# Patient Record
Sex: Male | Born: 1950 | Race: White | Hispanic: No | Marital: Single | Smoking: Current every day smoker
Health system: Southern US, Community
[De-identification: ages and names within clinical notes are randomized; demographics above are authoritative.]

## PROBLEM LIST (undated history)

## (undated) DIAGNOSIS — B009 Herpesviral infection, unspecified: Secondary | ICD-10-CM

## (undated) DIAGNOSIS — R011 Cardiac murmur, unspecified: Secondary | ICD-10-CM

## (undated) HISTORY — DX: Cardiac murmur, unspecified: R01.1

## (undated) HISTORY — DX: Herpesviral infection, unspecified: B00.9

---

## 2004-02-29 ENCOUNTER — Ambulatory Visit: Payer: Self-pay | Admitting: Family Medicine

## 2004-03-21 ENCOUNTER — Ambulatory Visit: Payer: Self-pay | Admitting: Family Medicine

## 2004-05-24 ENCOUNTER — Ambulatory Visit: Payer: Self-pay | Admitting: Family Medicine

## 2004-11-23 ENCOUNTER — Ambulatory Visit: Payer: Self-pay | Admitting: Family Medicine

## 2005-03-30 ENCOUNTER — Ambulatory Visit: Payer: Self-pay | Admitting: Family Medicine

## 2005-11-14 ENCOUNTER — Ambulatory Visit: Payer: Self-pay | Admitting: Family Medicine

## 2006-02-05 ENCOUNTER — Ambulatory Visit: Payer: Self-pay | Admitting: Family Medicine

## 2007-02-18 ENCOUNTER — Ambulatory Visit: Payer: Self-pay | Admitting: Family Medicine

## 2007-12-10 ENCOUNTER — Ambulatory Visit: Payer: Self-pay | Admitting: Family Medicine

## 2007-12-10 DIAGNOSIS — A6000 Herpesviral infection of urogenital system, unspecified: Secondary | ICD-10-CM | POA: Insufficient documentation

## 2007-12-12 ENCOUNTER — Telehealth: Payer: Self-pay

## 2008-02-24 ENCOUNTER — Ambulatory Visit: Payer: Self-pay | Admitting: Family Medicine

## 2008-02-24 DIAGNOSIS — B354 Tinea corporis: Secondary | ICD-10-CM | POA: Insufficient documentation

## 2008-04-15 ENCOUNTER — Telehealth: Payer: Self-pay | Admitting: Family Medicine

## 2008-09-07 ENCOUNTER — Ambulatory Visit: Payer: Self-pay | Admitting: Family Medicine

## 2008-09-07 DIAGNOSIS — L84 Corns and callosities: Secondary | ICD-10-CM | POA: Insufficient documentation

## 2008-09-14 ENCOUNTER — Ambulatory Visit: Payer: Self-pay | Admitting: Family Medicine

## 2008-09-14 LAB — CONVERTED CEMR LAB
Blood in Urine, dipstick: NEGATIVE
Nitrite: NEGATIVE
Protein, U semiquant: NEGATIVE
Urobilinogen, UA: 0.2
WBC Urine, dipstick: NEGATIVE

## 2008-09-21 ENCOUNTER — Ambulatory Visit: Payer: Self-pay | Admitting: Family Medicine

## 2008-09-21 DIAGNOSIS — M479 Spondylosis, unspecified: Secondary | ICD-10-CM | POA: Insufficient documentation

## 2008-09-21 LAB — CONVERTED CEMR LAB
Alkaline Phosphatase: 40 units/L (ref 39–117)
Basophils Absolute: 0 10*3/uL (ref 0.0–0.1)
Bilirubin, Direct: 0.1 mg/dL (ref 0.0–0.3)
Calcium: 9 mg/dL (ref 8.4–10.5)
GFR calc non Af Amer: 91.58 mL/min (ref >60)
Glucose, Bld: 88 mg/dL (ref 70–99)
HDL: 65.3 mg/dL (ref >39.00)
Hemoglobin: 13.9 g/dL (ref 13.0–17.0)
LDL Cholesterol: 97 mg/dL (ref 0–99)
Lymphocytes Relative: 24.8 % (ref 12.0–46.0)
Monocytes Relative: 8.8 % (ref 3.0–12.0)
Neutro Abs: 2.5 10*3/uL (ref 1.4–7.7)
RBC: 3.94 M/uL — ABNORMAL LOW (ref 4.22–5.81)
RDW: 11.5 % (ref 11.5–14.6)
Sodium: 141 meq/L (ref 135–145)
Total CHOL/HDL Ratio: 3
Total Protein: 6.7 g/dL (ref 6.0–8.3)
VLDL: 13.2 mg/dL (ref 0.0–40.0)
WBC: 5.7 10*3/uL (ref 4.5–10.5)

## 2008-09-30 ENCOUNTER — Ambulatory Visit: Payer: Self-pay | Admitting: Family Medicine

## 2008-09-30 LAB — CONVERTED CEMR LAB
OCCULT 1: NEGATIVE
OCCULT 2: NEGATIVE

## 2008-10-13 ENCOUNTER — Encounter: Payer: Self-pay | Admitting: Family Medicine

## 2008-11-22 ENCOUNTER — Encounter: Payer: Self-pay | Admitting: Family Medicine

## 2009-01-25 ENCOUNTER — Ambulatory Visit: Payer: Self-pay | Admitting: Family Medicine

## 2010-03-07 ENCOUNTER — Ambulatory Visit: Payer: Self-pay | Admitting: Family Medicine

## 2010-05-04 NOTE — Assessment & Plan Note (Signed)
Summary: FUP ON FUNGAL ISSUES/FLU SHOT/CJR   Vital Signs:  Patient profile:   60 year old male Height:      69.5 inches Weight:      162 pounds BMI:     23.67 O2 Sat:      95 % on Room air Temp:     98.4 degrees F oral Pulse rate:   76 / minute Pulse rhythm:   irregular BP sitting:   120 / 80  (left arm) Cuff size:   regular  Vitals Entered By: Romualdo Bolk, CMA (AAMA) (March 07, 2010 12:27 PM)  O2 Flow:  Room air CC: Follow-up visit on fungal infection   History of Present Illness: This 60 year old white single male is in for refill of his medications. He relates that he has had a recent flareup of herpes genitalis and would like to refill his Valtrex assailant for preventive measures. Also has developed a fungal infection in the inguinal region and around the scrotal Otherwise his arthritis is under good control and if it reoccurs slightly he uses ibuprofen Discussed taking aspirin 81 mg q.d. Proventil of CVA and MI Patient relates he continues to have problem with calluses on his feet but he is under the care of Dr. Lestine Box  Preventive Screening-Counseling & Management  Alcohol-Tobacco     Smoking Status: current  Current Medications (verified): 1)  Lotrisone 1-0.05 % Crea (Clotrimazole-Betamethasone) .... Apply Two Times A Day For 10 Days, Repeat Later As Needed 2)  Valtrex 500 Mg Tabs (Valacyclovir Hcl) .Marland Kitchen.. 1 Three Times A Day  For I Month Then 1 Per Day, Hold Until Pt Requests  Allergies (verified): 1)  ! Jonne Ply  Past History:  Past Surgical History: Last updated: 12/09/2006 Denies surgical history  Risk Factors: Smoking Status: current (03/07/2010)  Past Medical History: Heart Murmur herpes simplex2  Review of Systems      See HPI  The patient denies anorexia, fever, weight loss, weight gain, vision loss, decreased hearing, hoarseness, chest pain, syncope, dyspnea on exertion, peripheral edema, prolonged cough, headaches, hemoptysis, abdominal  pain, melena, hematochezia, severe indigestion/heartburn, hematuria, incontinence, genital sores, muscle weakness, suspicious skin lesions, transient blindness, difficulty walking, depression, unusual weight change, abnormal bleeding, enlarged lymph nodes, angioedema, breast masses, and testicular masses.    Physical Exam  General:  Well-developed,well-nourished,in no acute distress; alert,appropriate and cooperative throughout examination Lungs:  Normal respiratory effort, chest expands symmetrically. Lungs are clear to auscultation, no crackles or wheezes. 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  Complete Medication List: 1)  Lotrisone 1-0.05 % Crea (Clotrimazole-betamethasone) .... Apply two times a day for 10 days, repeat later as needed 2)  Valtrex 500 Mg Tabs (Valacyclovir hcl) .Marland Kitchen.. 1 qd 3)  Aspir-low 81 Mg Tbec (Aspirin) .Marland Kitchen.. 1 qd  Other Orders: Admin 1st Vaccine (98119) Flu Vaccine 62yrs + (14782)  Patient Instructions: 1)  he did have a recurrence of your possible infection and herpes I have refilled her medications for treatment 2)  Recommend starting aspirin 81 mg q.d. Prescriptions: LOTRISONE 1-0.05 % CREA (CLOTRIMAZOLE-BETAMETHASONE) apply two times a day for 10 days, repeat later  as needed  #45 gms x 11   Entered and Authorized by:   Judithann Sheen MD   Signed by:   Judithann Sheen MD on 03/07/2010   Method used:   Electronically to        [id number] (retail)       [address]       [place], [place]  [address]       Ph: [phone]       Fax:  [phone]   RxID:   [id number] VALTREX 500 MG TABS (VALACYCLOVIR HCL) 1 qd  #90 x 3   Entered and Authorized by:   Judithann Sheen MD   Signed by:   Judithann Sheen MD on 03/07/2010   Method used:   Electronically to        [facility] (retail)       [address]       [place], [place]  [address]       Ph: [phone]       Fax: [phone]   RxID:   [id number]    Orders Added: 1)  Admin 1st Vaccine [90471] 2)  Flu Vaccine 62yrs + [88416] 3)  Est. Patient Level III [60630]   Flu Vaccine Consent Questions     Do you have a history of severe allergic reactions to this vaccine? no    Any prior history of allergic reactions to egg and/or gelatin? no    Do you have a sensitivity to the preservative Thimersol? no    Do you have a past history of Guillan-Barre Syndrome? no    Do you currently have an acute febrile illness? no    Have you ever had a severe reaction to latex? no    Vaccine information given and explained to patient? yes    Are you currently pregnant? no    Lot Number:[id number]   Exp Date:09/30/2010   Site Given  Left Deltoid IM.lbflu Romualdo Bolk, CMA (AAMA)  March 07, 2010 12:29 PM

## 2010-07-25 ENCOUNTER — Other Ambulatory Visit (INDEPENDENT_AMBULATORY_CARE_PROVIDER_SITE_OTHER): Payer: BC Managed Care – PPO | Admitting: Family Medicine

## 2010-07-25 DIAGNOSIS — Z0000 Encounter for general adult medical examination without abnormal findings: Secondary | ICD-10-CM

## 2010-07-25 LAB — CBC WITH DIFFERENTIAL/PLATELET
Eosinophils Relative: 19 % — ABNORMAL HIGH (ref 0.0–5.0)
HCT: 40.5 % (ref 39.0–52.0)
Hemoglobin: 14 g/dL (ref 13.0–17.0)
Lymphs Abs: 1.4 10*3/uL (ref 0.7–4.0)
Monocytes Relative: 7.3 % (ref 3.0–12.0)
Neutro Abs: 3.7 10*3/uL (ref 1.4–7.7)
RBC: 3.97 Mil/uL — ABNORMAL LOW (ref 4.22–5.81)
WBC: 6.9 10*3/uL (ref 4.5–10.5)

## 2010-07-25 LAB — HEPATIC FUNCTION PANEL
ALT: 15 U/L (ref 0–53)
AST: 17 U/L (ref 0–37)
Albumin: 4.1 g/dL (ref 3.5–5.2)

## 2010-07-25 LAB — POCT URINALYSIS DIPSTICK
Glucose, UA: NEGATIVE
Ketones, UA: NEGATIVE
Leukocytes, UA: NEGATIVE
Spec Grav, UA: 1.02
Urobilinogen, UA: 0.2

## 2010-07-25 LAB — BASIC METABOLIC PANEL
GFR: 117.74 mL/min (ref >60.00)
Glucose, Bld: 92 mg/dL (ref 70–99)
Potassium: 4.2 mEq/L (ref 3.5–5.1)
Sodium: 139 mEq/L (ref 135–145)

## 2010-07-25 LAB — LIPID PANEL
Cholesterol: 178 mg/dL (ref 0–200)
LDL Cholesterol: 81 mg/dL (ref 0–99)

## 2010-07-25 LAB — TSH: TSH: 1.74 u[IU]/mL (ref 0.35–5.50)

## 2010-07-25 LAB — PSA: PSA: 1.97 ng/mL (ref 0.10–4.00)

## 2010-08-01 ENCOUNTER — Encounter: Payer: Self-pay | Admitting: Family Medicine

## 2010-08-01 ENCOUNTER — Ambulatory Visit (INDEPENDENT_AMBULATORY_CARE_PROVIDER_SITE_OTHER): Payer: BC Managed Care – PPO | Admitting: Family Medicine

## 2010-08-01 VITALS — BP 124/70 | HR 77 | Temp 98.0°F | Ht 69.5 in | Wt 157.0 lb

## 2010-08-01 DIAGNOSIS — Z136 Encounter for screening for cardiovascular disorders: Secondary | ICD-10-CM

## 2010-08-01 DIAGNOSIS — Z8619 Personal history of other infectious and parasitic diseases: Secondary | ICD-10-CM

## 2010-08-01 DIAGNOSIS — Z0000 Encounter for general adult medical examination without abnormal findings: Secondary | ICD-10-CM

## 2010-08-14 ENCOUNTER — Encounter: Payer: Self-pay | Admitting: Family Medicine

## 2010-08-14 NOTE — Progress Notes (Signed)
  Subjective:    Patient ID: Howard Duncan, male    DOB: 09/21/1950, 60 y.o.   MRN: [id number] 60 year old white male employee of  [employer] is in for complete physical examination and relates that he has no complaint he does have past history of calluses which are under control at this time he relates he is doing fine laboratory studies were good also we'll maintenance therapy for herpes simplex 2 HPI    Review of Systems  Constitutional: Negative.   HENT: Negative.   Eyes: Negative.   Respiratory: Negative.   Cardiovascular: Negative.   Gastrointestinal: Negative.   Genitourinary: Negative.        History herpes simplex 2  Musculoskeletal: Negative.   Skin: Negative.   Neurological: Negative.   Hematological: Negative.   Psychiatric/Behavioral: Negative.        Objective:   Physical Exam patient is well-developed well-nourished pleasant male in no distress HEENT negative carotids normal thyroid nonpalpable Lungs clear to palpation percussion  Auscultation,No rales no dullness no wheezing Heart no evidence of cardiomegaly heart sounds without murmur peripheral pulsations are good bilaterally Abdomen liver spleen kidney nonpalpable no masses aorta percusses normal inguinal regionsgions normal Genitalia negative Prostate completely normal no masses no tenderness Extremities negative except for evidence of previous calluses on the bottom of the feet no evidence of infection Neurological examination negative deep tendon reflexes 2+ bilaterally Skin negative       Assessment & Plan:  Physical examination and laboratory studies are all well within normal limits EKG normal Refill medication of Lotrisoneand Valtrex

## 2010-08-14 NOTE — Patient Instructions (Signed)
The physical examination and laboratory studies are allithin normal limits Electrocardiogram normal Refill Lotrisone if you have recurrence of the rash Continue herpes simplex 2 preventative therapy of Valtrex

## 2010-08-22 ENCOUNTER — Other Ambulatory Visit (INDEPENDENT_AMBULATORY_CARE_PROVIDER_SITE_OTHER): Payer: BC Managed Care – PPO | Admitting: Family Medicine

## 2010-08-22 DIAGNOSIS — K921 Melena: Secondary | ICD-10-CM

## 2010-08-22 DIAGNOSIS — IMO0001 Reserved for inherently not codable concepts without codable children: Secondary | ICD-10-CM

## 2010-08-22 LAB — HEMOCCULT GUIAC POC 1CARD (OFFICE): Fecal Occult Blood, POC: NEGATIVE

## 2011-03-13 ENCOUNTER — Ambulatory Visit (INDEPENDENT_AMBULATORY_CARE_PROVIDER_SITE_OTHER): Payer: BC Managed Care – PPO | Admitting: Family Medicine

## 2011-03-13 ENCOUNTER — Encounter: Payer: Self-pay | Admitting: Family Medicine

## 2011-03-13 VITALS — BP 130/80 | Temp 98.5°F | Wt 155.0 lb

## 2011-03-13 DIAGNOSIS — A6000 Herpesviral infection of urogenital system, unspecified: Secondary | ICD-10-CM

## 2011-03-13 DIAGNOSIS — Z7189 Other specified counseling: Secondary | ICD-10-CM

## 2011-03-13 DIAGNOSIS — Z23 Encounter for immunization: Secondary | ICD-10-CM

## 2011-03-13 DIAGNOSIS — Z716 Tobacco abuse counseling: Secondary | ICD-10-CM | POA: Insufficient documentation

## 2011-03-13 DIAGNOSIS — B354 Tinea corporis: Secondary | ICD-10-CM

## 2011-03-13 MED ORDER — ACYCLOVIR 400 MG PO TABS: ORAL_TABLET | ORAL | Status: AC

## 2011-03-13 MED ORDER — VARENICLINE TARTRATE 1 MG PO TABS: 1.0000 mg | ORAL_TABLET | Freq: Two times a day (BID) | ORAL | Status: AC

## 2011-03-13 NOTE — Progress Notes (Signed)
  Subjective:    Patient ID: Howard Duncan, male    DOB: 01/03/1953, 60 y.o.   MRN: [id number]  HPI Michial is a 60 year old male, single, smoker, one pack of cigarettes a day for 35 years and comes in to get established.  He states it all the time these being seen by Dr. Scotty Court.............Marland Kitchen Dr. Scotty Court.  Never discussed a smoking cessation program.!!!!!!!!!!!!!!!!!! He currently takes Valtrex 500 mg daily to prevent an outbreak of herpes.  He only had it one episode and when a while.  It's not been a recurrent problem.  Discussed the options of daily therapy versus intermittent period and since his time in a recurrent problem.  Did not recommend daily therapy.  He took an over-the-counter anti-fungal for groin itch and fungal infection.  It went away.  Now using small amounts twice weekly.  Advise since the rash is gone.  He can stop the  A last physical examination was June 2012  Review of Systems    General review of systems otherwise negative Objective:   Physical Exam  Well-developed well-nourished, male in no acute distress...Marland KitchenMarland KitchenMarland Kitchenreeks of tobacco smoke      Assessment & Plan:  Tobacco abuse, begin smoking cessation program with the chantix one half tab daily, and outlined a tapering program.  Follow-up in 4 weeks.  History of fungal groin infection, now resolved stop the anti-fungal.  History of HSV asymptomatic stopped the Valtrex

## 2011-03-13 NOTE — Patient Instructions (Signed)
Stop the Lotrisone.  Stop  the Valtrex.  Begin chantix one half tab q.a.m........... In begin tapering by 5 cigarettes per week...........15.........10.........5.........4.....3......2...1....Marland KitchenMarland [id number]  Return in 4 weeks for follow-up

## 2015-01-06 DIAGNOSIS — H524 Presbyopia: Secondary | ICD-10-CM | POA: Diagnosis not present

## 2015-01-06 DIAGNOSIS — H5211 Myopia, right eye: Secondary | ICD-10-CM | POA: Diagnosis not present

## 2015-01-27 DIAGNOSIS — Z23 Encounter for immunization: Secondary | ICD-10-CM | POA: Diagnosis not present

## 2015-03-30 DIAGNOSIS — R69 Illness, unspecified: Secondary | ICD-10-CM | POA: Diagnosis not present

## 2015-12-16 DIAGNOSIS — Z1211 Encounter for screening for malignant neoplasm of colon: Secondary | ICD-10-CM | POA: Diagnosis not present

## 2015-12-16 DIAGNOSIS — R69 Illness, unspecified: Secondary | ICD-10-CM | POA: Diagnosis not present

## 2015-12-16 DIAGNOSIS — Z131 Encounter for screening for diabetes mellitus: Secondary | ICD-10-CM | POA: Diagnosis not present

## 2015-12-16 DIAGNOSIS — Z23 Encounter for immunization: Secondary | ICD-10-CM | POA: Diagnosis not present

## 2015-12-16 DIAGNOSIS — Z0000 Encounter for general adult medical examination without abnormal findings: Secondary | ICD-10-CM | POA: Diagnosis not present

## 2016-01-09 DIAGNOSIS — Z1211 Encounter for screening for malignant neoplasm of colon: Secondary | ICD-10-CM | POA: Diagnosis not present

## 2016-01-16 DIAGNOSIS — H524 Presbyopia: Secondary | ICD-10-CM | POA: Diagnosis not present

## 2016-03-28 DIAGNOSIS — R69 Illness, unspecified: Secondary | ICD-10-CM | POA: Diagnosis not present

## 2016-09-26 DIAGNOSIS — R69 Illness, unspecified: Secondary | ICD-10-CM | POA: Diagnosis not present

## 2016-12-21 DIAGNOSIS — Z125 Encounter for screening for malignant neoplasm of prostate: Secondary | ICD-10-CM | POA: Diagnosis not present

## 2016-12-21 DIAGNOSIS — Z23 Encounter for immunization: Secondary | ICD-10-CM | POA: Diagnosis not present

## 2016-12-21 DIAGNOSIS — R634 Abnormal weight loss: Secondary | ICD-10-CM | POA: Diagnosis not present

## 2016-12-21 DIAGNOSIS — Z0000 Encounter for general adult medical examination without abnormal findings: Secondary | ICD-10-CM | POA: Diagnosis not present

## 2016-12-21 DIAGNOSIS — Z1211 Encounter for screening for malignant neoplasm of colon: Secondary | ICD-10-CM | POA: Diagnosis not present

## 2016-12-21 DIAGNOSIS — R69 Illness, unspecified: Secondary | ICD-10-CM | POA: Diagnosis not present

## 2016-12-21 DIAGNOSIS — Z1389 Encounter for screening for other disorder: Secondary | ICD-10-CM | POA: Diagnosis not present

## 2017-01-04 ENCOUNTER — Other Ambulatory Visit: Payer: Self-pay | Admitting: Family Medicine

## 2017-01-04 ENCOUNTER — Ambulatory Visit
Admission: RE | Admit: 2017-01-04 | Discharge: 2017-01-04 | Disposition: A | Discharge status: Home / Self Care | Payer: Medicare HMO | Source: Ambulatory Visit | Attending: Family Medicine | Admitting: Family Medicine

## 2017-01-04 DIAGNOSIS — D721 Eosinophilia, unspecified: Secondary | ICD-10-CM

## 2017-01-04 DIAGNOSIS — R634 Abnormal weight loss: Secondary | ICD-10-CM

## 2017-01-14 DIAGNOSIS — R195 Other fecal abnormalities: Secondary | ICD-10-CM | POA: Diagnosis not present

## 2017-01-21 DIAGNOSIS — H524 Presbyopia: Secondary | ICD-10-CM | POA: Diagnosis not present

## 2017-01-21 DIAGNOSIS — H2513 Age-related nuclear cataract, bilateral: Secondary | ICD-10-CM | POA: Diagnosis not present

## 2017-02-28 DIAGNOSIS — D126 Benign neoplasm of colon, unspecified: Secondary | ICD-10-CM | POA: Diagnosis not present

## 2017-02-28 DIAGNOSIS — K648 Other hemorrhoids: Secondary | ICD-10-CM | POA: Diagnosis not present

## 2017-02-28 DIAGNOSIS — R195 Other fecal abnormalities: Secondary | ICD-10-CM | POA: Diagnosis not present

## 2017-03-05 DIAGNOSIS — D126 Benign neoplasm of colon, unspecified: Secondary | ICD-10-CM | POA: Diagnosis not present

## 2017-04-01 DIAGNOSIS — R69 Illness, unspecified: Secondary | ICD-10-CM | POA: Diagnosis not present

## 2017-09-19 DIAGNOSIS — D126 Benign neoplasm of colon, unspecified: Secondary | ICD-10-CM | POA: Diagnosis not present

## 2017-09-19 DIAGNOSIS — Z86010 Personal history of colonic polyps: Secondary | ICD-10-CM | POA: Diagnosis not present

## 2017-09-24 DIAGNOSIS — D126 Benign neoplasm of colon, unspecified: Secondary | ICD-10-CM | POA: Diagnosis not present

## 2017-09-25 DIAGNOSIS — R69 Illness, unspecified: Secondary | ICD-10-CM | POA: Diagnosis not present

## 2017-12-27 DIAGNOSIS — Z1389 Encounter for screening for other disorder: Secondary | ICD-10-CM | POA: Diagnosis not present

## 2017-12-27 DIAGNOSIS — R634 Abnormal weight loss: Secondary | ICD-10-CM | POA: Diagnosis not present

## 2017-12-27 DIAGNOSIS — Z0000 Encounter for general adult medical examination without abnormal findings: Secondary | ICD-10-CM | POA: Diagnosis not present

## 2017-12-27 DIAGNOSIS — Z23 Encounter for immunization: Secondary | ICD-10-CM | POA: Diagnosis not present

## 2017-12-27 DIAGNOSIS — Z86010 Personal history of colonic polyps: Secondary | ICD-10-CM | POA: Diagnosis not present

## 2017-12-27 DIAGNOSIS — Z131 Encounter for screening for diabetes mellitus: Secondary | ICD-10-CM | POA: Diagnosis not present

## 2017-12-27 DIAGNOSIS — R69 Illness, unspecified: Secondary | ICD-10-CM | POA: Diagnosis not present

## 2017-12-27 DIAGNOSIS — Z1159 Encounter for screening for other viral diseases: Secondary | ICD-10-CM | POA: Diagnosis not present

## 2017-12-27 DIAGNOSIS — Z125 Encounter for screening for malignant neoplasm of prostate: Secondary | ICD-10-CM | POA: Diagnosis not present

## 2018-01-27 DIAGNOSIS — H524 Presbyopia: Secondary | ICD-10-CM | POA: Diagnosis not present

## 2018-01-27 DIAGNOSIS — H5213 Myopia, bilateral: Secondary | ICD-10-CM | POA: Diagnosis not present

## 2018-01-27 DIAGNOSIS — H52203 Unspecified astigmatism, bilateral: Secondary | ICD-10-CM | POA: Diagnosis not present

## 2018-01-27 DIAGNOSIS — H2513 Age-related nuclear cataract, bilateral: Secondary | ICD-10-CM | POA: Diagnosis not present

## 2018-02-03 DIAGNOSIS — Z0100 Encounter for examination of eyes and vision without abnormal findings: Secondary | ICD-10-CM | POA: Diagnosis not present

## 2018-02-03 DIAGNOSIS — H524 Presbyopia: Secondary | ICD-10-CM | POA: Diagnosis not present

## 2018-03-17 DIAGNOSIS — R69 Illness, unspecified: Secondary | ICD-10-CM | POA: Diagnosis not present

## 2018-09-17 DIAGNOSIS — R69 Illness, unspecified: Secondary | ICD-10-CM | POA: Diagnosis not present

## 2018-12-17 DIAGNOSIS — Z86010 Personal history of colonic polyps: Secondary | ICD-10-CM | POA: Diagnosis not present

## 2018-12-17 DIAGNOSIS — K5900 Constipation, unspecified: Secondary | ICD-10-CM | POA: Diagnosis not present

## 2018-12-28 IMAGING — CR DG CHEST 2V
2 series · 2 of 2 positions shown · non-contrast
Comparison: None.

CLINICAL DATA: Weight loss, he is in of milia

EXAM:
CHEST  2 VIEW

[w chest pa]
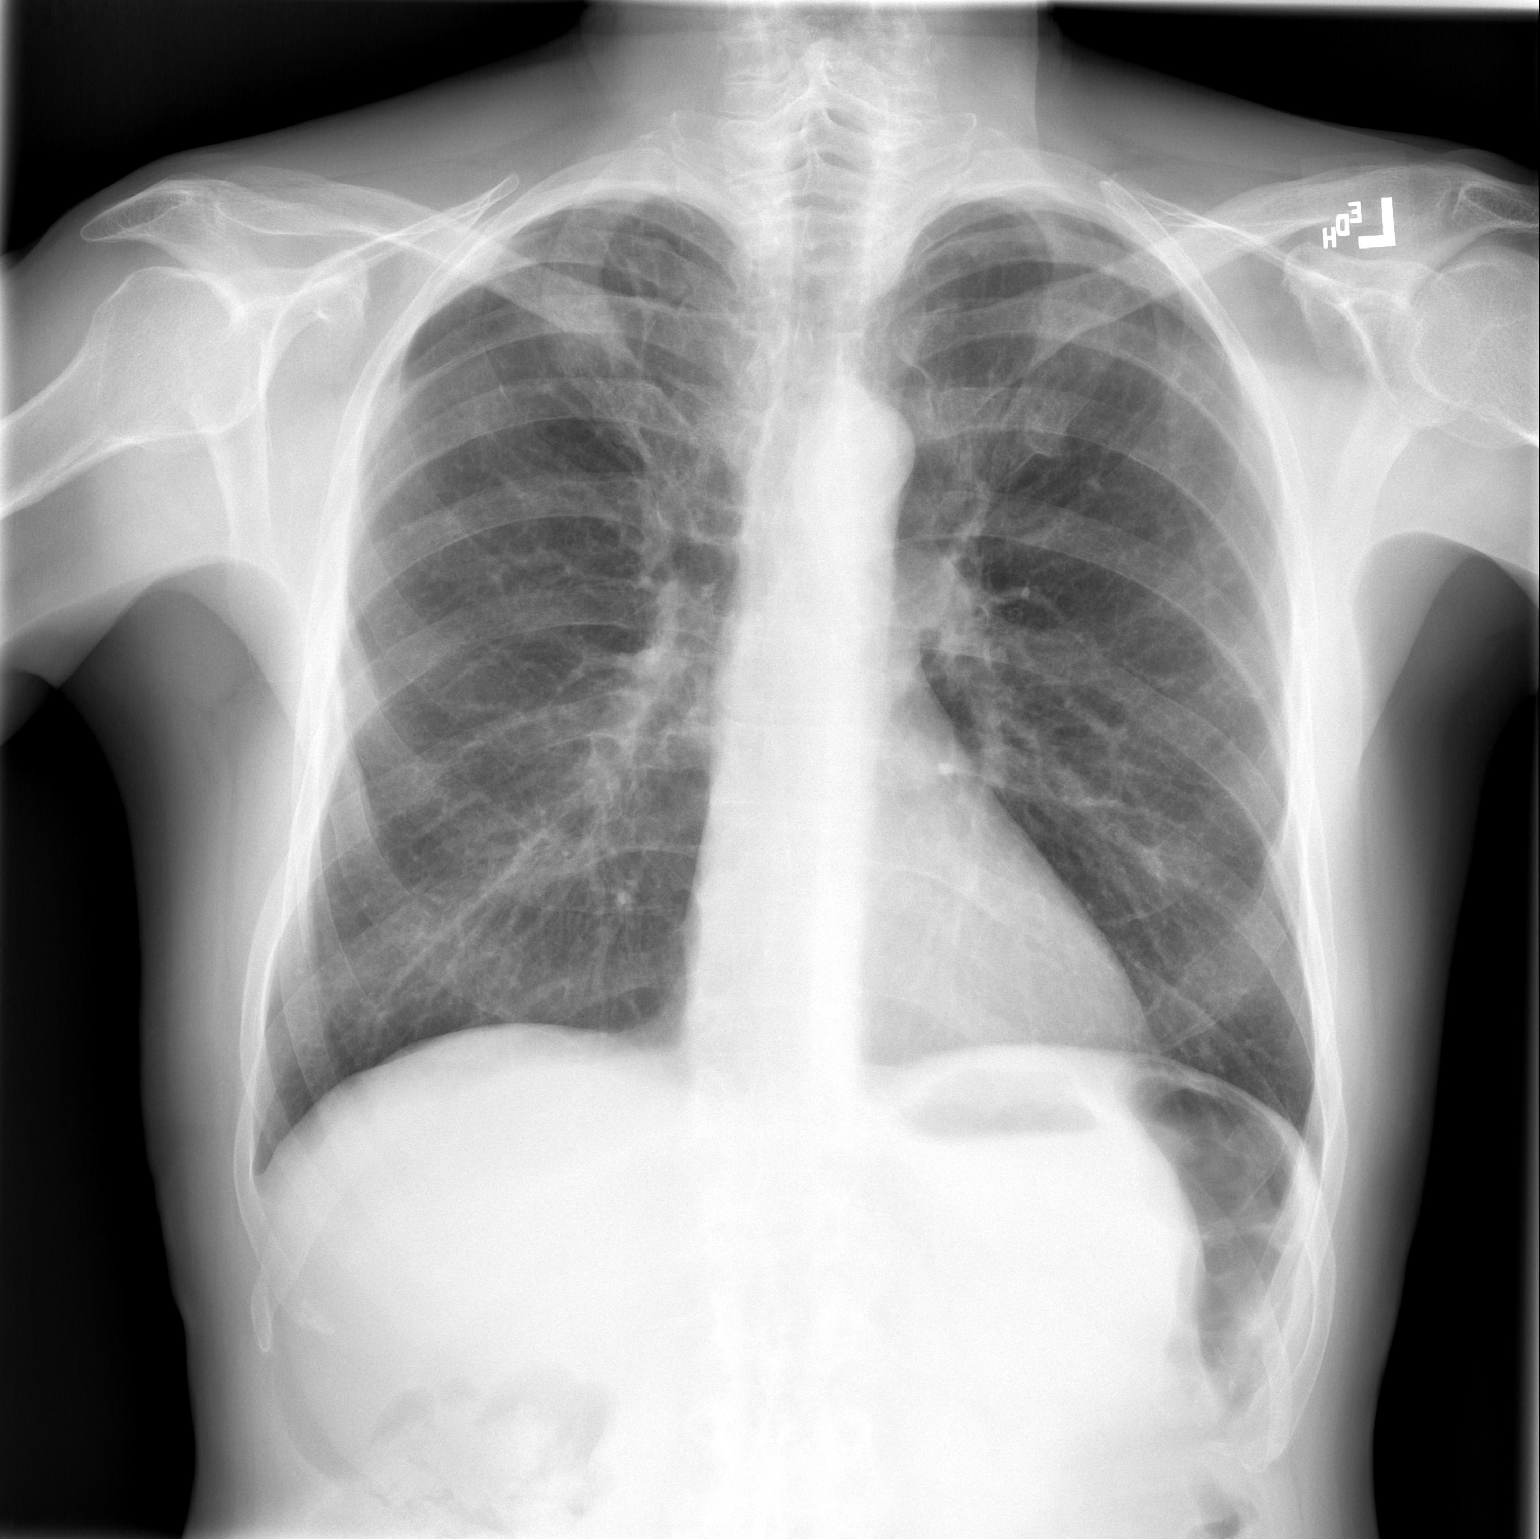

[w chest lat]
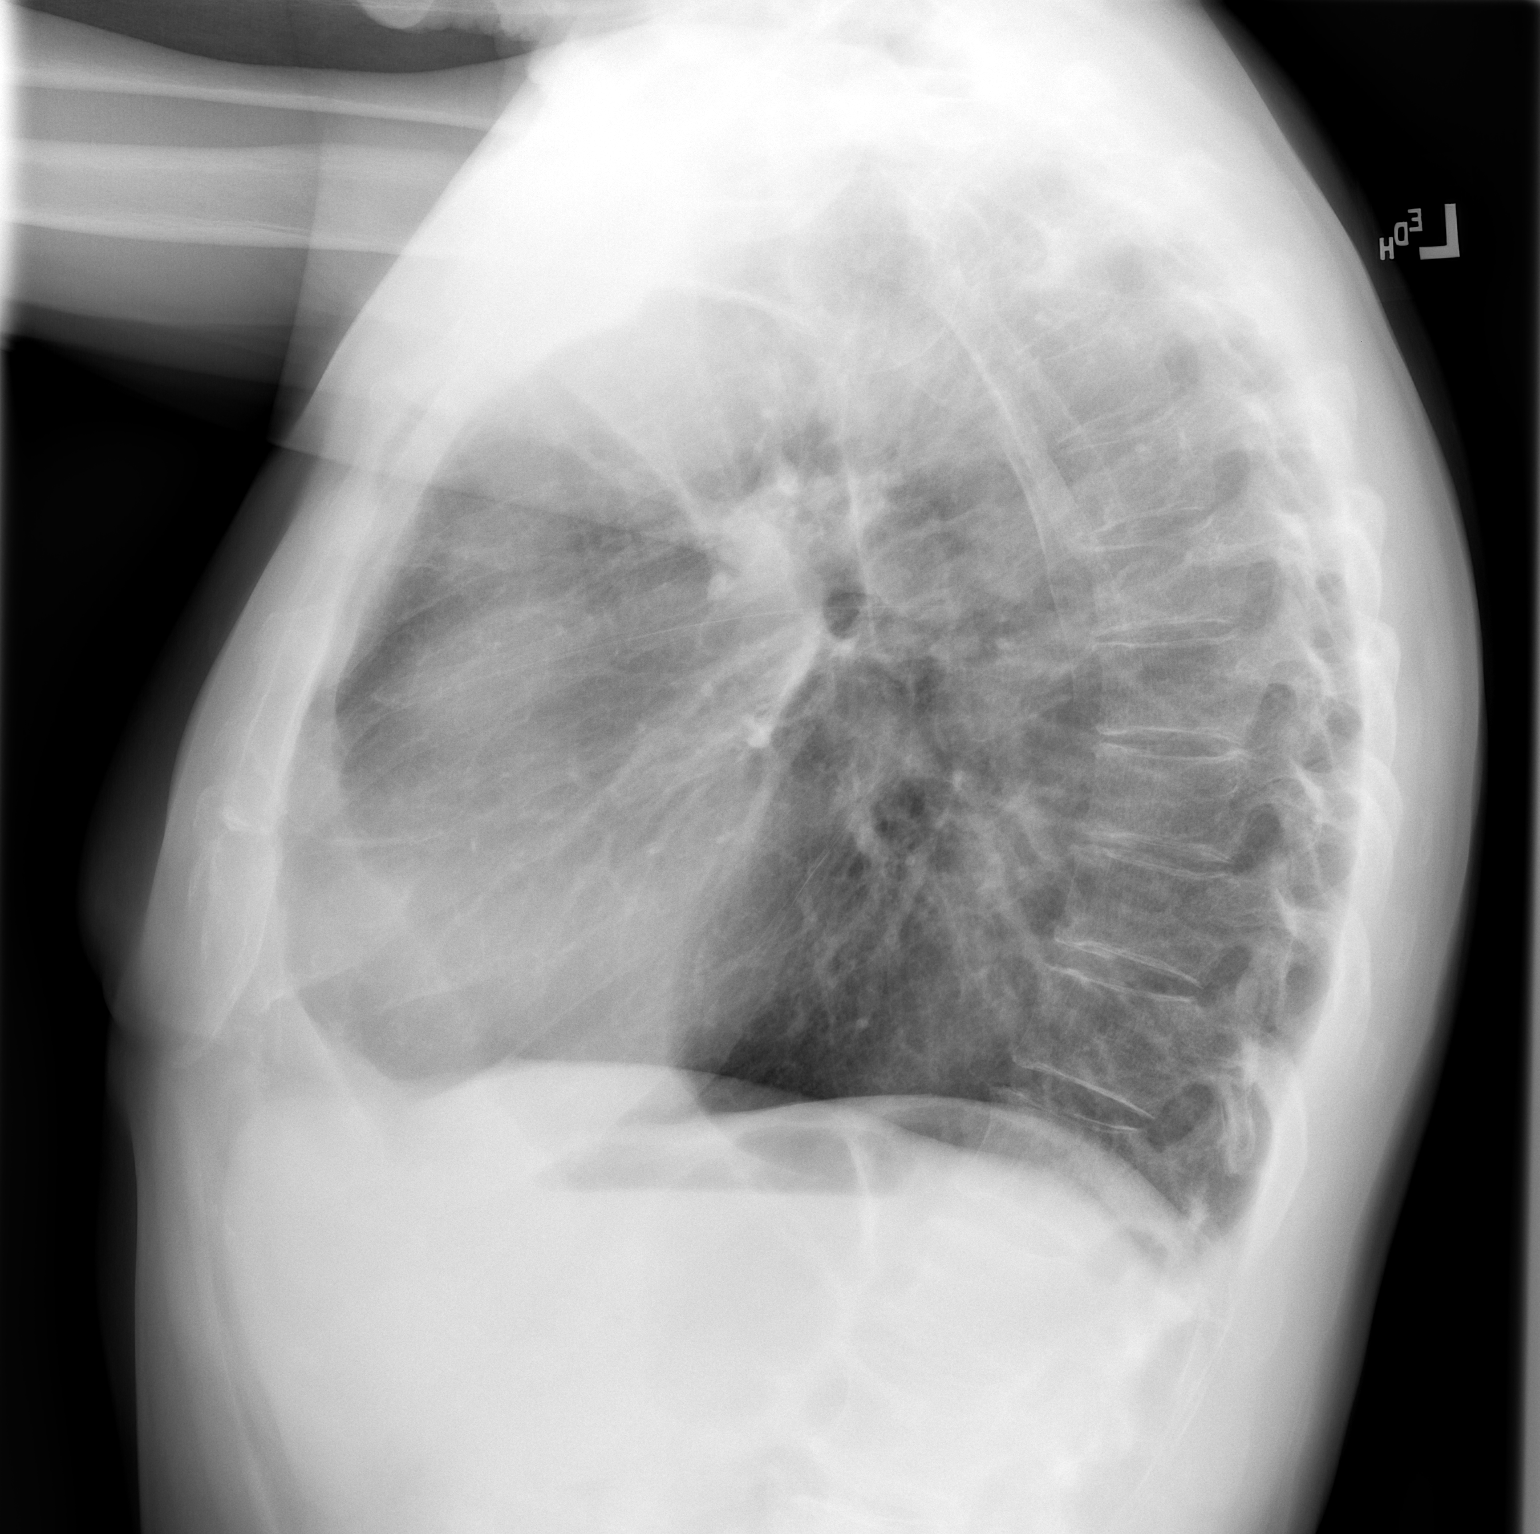

[2 of 2 positions shown; findings below may reference images not displayed]

FINDINGS: The heart size and mediastinal contours are within normal limits.
Both lungs are clear. The visualized skeletal structures are
unremarkable.
IMPRESSION: No active cardiopulmonary disease.

## 2019-01-20 DIAGNOSIS — Z86010 Personal history of colonic polyps: Secondary | ICD-10-CM | POA: Diagnosis not present

## 2019-01-20 DIAGNOSIS — Z0000 Encounter for general adult medical examination without abnormal findings: Secondary | ICD-10-CM | POA: Diagnosis not present

## 2019-01-20 DIAGNOSIS — Z1389 Encounter for screening for other disorder: Secondary | ICD-10-CM | POA: Diagnosis not present

## 2019-01-20 DIAGNOSIS — R69 Illness, unspecified: Secondary | ICD-10-CM | POA: Diagnosis not present

## 2019-01-24 DIAGNOSIS — Z23 Encounter for immunization: Secondary | ICD-10-CM | POA: Diagnosis not present

## 2019-01-30 DIAGNOSIS — H524 Presbyopia: Secondary | ICD-10-CM | POA: Diagnosis not present

## 2019-01-30 DIAGNOSIS — H2513 Age-related nuclear cataract, bilateral: Secondary | ICD-10-CM | POA: Diagnosis not present

## 2019-02-16 DIAGNOSIS — Z1159 Encounter for screening for other viral diseases: Secondary | ICD-10-CM | POA: Diagnosis not present

## 2019-02-19 DIAGNOSIS — K6289 Other specified diseases of anus and rectum: Secondary | ICD-10-CM | POA: Diagnosis not present

## 2019-02-19 DIAGNOSIS — K635 Polyp of colon: Secondary | ICD-10-CM | POA: Diagnosis not present

## 2019-02-19 DIAGNOSIS — D124 Benign neoplasm of descending colon: Secondary | ICD-10-CM | POA: Diagnosis not present

## 2019-02-19 DIAGNOSIS — D123 Benign neoplasm of transverse colon: Secondary | ICD-10-CM | POA: Diagnosis not present

## 2019-02-19 DIAGNOSIS — Z86010 Personal history of colonic polyps: Secondary | ICD-10-CM | POA: Diagnosis not present

## 2019-02-19 DIAGNOSIS — D125 Benign neoplasm of sigmoid colon: Secondary | ICD-10-CM | POA: Diagnosis not present

## 2019-02-19 DIAGNOSIS — K648 Other hemorrhoids: Secondary | ICD-10-CM | POA: Diagnosis not present

## 2019-02-24 DIAGNOSIS — K635 Polyp of colon: Secondary | ICD-10-CM | POA: Diagnosis not present

## 2019-02-24 DIAGNOSIS — D125 Benign neoplasm of sigmoid colon: Secondary | ICD-10-CM | POA: Diagnosis not present

## 2019-02-24 DIAGNOSIS — D123 Benign neoplasm of transverse colon: Secondary | ICD-10-CM | POA: Diagnosis not present

## 2019-02-24 DIAGNOSIS — D124 Benign neoplasm of descending colon: Secondary | ICD-10-CM | POA: Diagnosis not present

## 2019-04-01 DIAGNOSIS — R69 Illness, unspecified: Secondary | ICD-10-CM | POA: Diagnosis not present

## 2019-05-28 ENCOUNTER — Ambulatory Visit: Payer: Medicare HMO | Attending: Internal Medicine

## 2019-05-28 DIAGNOSIS — Z23 Encounter for immunization: Secondary | ICD-10-CM | POA: Insufficient documentation

## 2019-05-28 NOTE — Progress Notes (Signed)
   Covid-19 Vaccination Clinic  Name:  Howard Duncan    MRN: [id number] DOB: [birth year]  05/28/2019  Howard Duncan was observed post Covid-19 immunization for 15 minutes without incidence. He was provided with Vaccine Information Sheet and instruction to access the V-Safe system.   Howard Duncan was instructed to call 911 with any severe reactions post vaccine: Marland Kitchen Difficulty breathing  . Swelling of your face and throat  . A fast heartbeat  . A bad rash all over your body  . Dizziness and weakness    Immunizations Administered    Name Date Dose VIS Date Route   Pfizer COVID-19 Vaccine 05/28/2019  2:59 PM 0.3 mL 03/13/2019 Intramuscular   Manufacturer: [place]   Lot: [id number]   [place]: [id number]

## 2019-06-23 ENCOUNTER — Ambulatory Visit: Payer: Medicare HMO | Attending: Internal Medicine

## 2019-06-23 DIAGNOSIS — Z23 Encounter for immunization: Secondary | ICD-10-CM

## 2019-06-23 NOTE — Progress Notes (Signed)
   Covid-19 Vaccination Clinic  Name:  Howard Duncan    MRN: [id number] DOB: Sep 02, 1950  06/23/2019  Howard Duncan was observed post Covid-19 immunization for 15 minutes without incident. He was provided with Vaccine Information Sheet and instruction to access the V-Safe system.   Howard Duncan was instructed to call 911 with any severe reactions post vaccine: Marland Kitchen Difficulty breathing  . Swelling of face and throat  . A fast heartbeat  . A bad rash all over body  . Dizziness and weakness   Immunizations Administered    Name Date Dose VIS Date Route   Pfizer COVID-19 Vaccine 06/23/2019  2:46 PM 0.3 mL 03/13/2019 Intramuscular   Manufacturer: [place]   Lot: [id number]   [place]: [id number]

## 2019-09-21 DIAGNOSIS — R69 Illness, unspecified: Secondary | ICD-10-CM | POA: Diagnosis not present

## 2019-10-21 DIAGNOSIS — H00025 Hordeolum internum left lower eyelid: Secondary | ICD-10-CM | POA: Diagnosis not present

## 2019-11-23 DIAGNOSIS — H0015 Chalazion left lower eyelid: Secondary | ICD-10-CM | POA: Diagnosis not present

## 2019-12-21 DIAGNOSIS — R69 Illness, unspecified: Secondary | ICD-10-CM | POA: Diagnosis not present

## 2020-02-02 DIAGNOSIS — H2513 Age-related nuclear cataract, bilateral: Secondary | ICD-10-CM | POA: Diagnosis not present

## 2020-02-02 DIAGNOSIS — H524 Presbyopia: Secondary | ICD-10-CM | POA: Diagnosis not present

## 2020-02-02 DIAGNOSIS — H0014 Chalazion left upper eyelid: Secondary | ICD-10-CM | POA: Diagnosis not present

## 2020-02-10 DIAGNOSIS — Z86010 Personal history of colonic polyps: Secondary | ICD-10-CM | POA: Diagnosis not present

## 2020-02-10 DIAGNOSIS — R69 Illness, unspecified: Secondary | ICD-10-CM | POA: Diagnosis not present

## 2020-02-10 DIAGNOSIS — Z1389 Encounter for screening for other disorder: Secondary | ICD-10-CM | POA: Diagnosis not present

## 2020-02-10 DIAGNOSIS — Z0000 Encounter for general adult medical examination without abnormal findings: Secondary | ICD-10-CM | POA: Diagnosis not present

## 2020-02-10 DIAGNOSIS — Z131 Encounter for screening for diabetes mellitus: Secondary | ICD-10-CM | POA: Diagnosis not present

## 2020-02-10 DIAGNOSIS — Z125 Encounter for screening for malignant neoplasm of prostate: Secondary | ICD-10-CM | POA: Diagnosis not present

## 2020-02-10 DIAGNOSIS — Z13220 Encounter for screening for lipoid disorders: Secondary | ICD-10-CM | POA: Diagnosis not present

## 2020-02-10 DIAGNOSIS — Z136 Encounter for screening for cardiovascular disorders: Secondary | ICD-10-CM | POA: Diagnosis not present

## 2020-03-02 DIAGNOSIS — R69 Illness, unspecified: Secondary | ICD-10-CM | POA: Diagnosis not present

## 2020-03-24 DIAGNOSIS — L309 Dermatitis, unspecified: Secondary | ICD-10-CM | POA: Diagnosis not present

## 2020-03-24 DIAGNOSIS — L0390 Cellulitis, unspecified: Secondary | ICD-10-CM | POA: Diagnosis not present

## 2020-04-05 DIAGNOSIS — L0390 Cellulitis, unspecified: Secondary | ICD-10-CM | POA: Diagnosis not present

## 2020-04-05 DIAGNOSIS — L309 Dermatitis, unspecified: Secondary | ICD-10-CM | POA: Diagnosis not present

## 2020-04-28 DIAGNOSIS — R609 Edema, unspecified: Secondary | ICD-10-CM | POA: Diagnosis not present

## 2020-04-28 DIAGNOSIS — I868 Varicose veins of other specified sites: Secondary | ICD-10-CM | POA: Diagnosis not present

## 2021-02-01 DIAGNOSIS — H25813 Combined forms of age-related cataract, bilateral: Secondary | ICD-10-CM | POA: Diagnosis not present

## 2021-02-01 DIAGNOSIS — H524 Presbyopia: Secondary | ICD-10-CM | POA: Diagnosis not present

## 2021-02-14 DIAGNOSIS — Z125 Encounter for screening for malignant neoplasm of prostate: Secondary | ICD-10-CM | POA: Diagnosis not present

## 2021-02-14 DIAGNOSIS — Z131 Encounter for screening for diabetes mellitus: Secondary | ICD-10-CM | POA: Diagnosis not present

## 2021-02-14 DIAGNOSIS — Z0000 Encounter for general adult medical examination without abnormal findings: Secondary | ICD-10-CM | POA: Diagnosis not present

## 2021-02-14 DIAGNOSIS — Z1389 Encounter for screening for other disorder: Secondary | ICD-10-CM | POA: Diagnosis not present

## 2021-02-14 DIAGNOSIS — Z86010 Personal history of colonic polyps: Secondary | ICD-10-CM | POA: Diagnosis not present

## 2021-02-14 DIAGNOSIS — R69 Illness, unspecified: Secondary | ICD-10-CM | POA: Diagnosis not present

## 2021-06-27 DIAGNOSIS — H10501 Unspecified blepharoconjunctivitis, right eye: Secondary | ICD-10-CM | POA: Diagnosis not present

## 2021-07-17 DIAGNOSIS — Z0100 Encounter for examination of eyes and vision without abnormal findings: Secondary | ICD-10-CM | POA: Diagnosis not present

## 2021-07-17 DIAGNOSIS — H04123 Dry eye syndrome of bilateral lacrimal glands: Secondary | ICD-10-CM | POA: Diagnosis not present

## 2021-07-17 DIAGNOSIS — H524 Presbyopia: Secondary | ICD-10-CM | POA: Diagnosis not present

## 2022-02-01 DIAGNOSIS — H25813 Combined forms of age-related cataract, bilateral: Secondary | ICD-10-CM | POA: Diagnosis not present

## 2022-02-01 DIAGNOSIS — H524 Presbyopia: Secondary | ICD-10-CM | POA: Diagnosis not present

## 2022-02-15 DIAGNOSIS — Z125 Encounter for screening for malignant neoplasm of prostate: Secondary | ICD-10-CM | POA: Diagnosis not present

## 2022-02-15 DIAGNOSIS — Z86010 Personal history of colonic polyps: Secondary | ICD-10-CM | POA: Diagnosis not present

## 2022-02-15 DIAGNOSIS — R69 Illness, unspecified: Secondary | ICD-10-CM | POA: Diagnosis not present

## 2022-02-15 DIAGNOSIS — Z0000 Encounter for general adult medical examination without abnormal findings: Secondary | ICD-10-CM | POA: Diagnosis not present

## 2022-02-15 DIAGNOSIS — Z1331 Encounter for screening for depression: Secondary | ICD-10-CM | POA: Diagnosis not present

## 2022-02-15 DIAGNOSIS — Z131 Encounter for screening for diabetes mellitus: Secondary | ICD-10-CM | POA: Diagnosis not present

## 2022-07-30 DIAGNOSIS — D124 Benign neoplasm of descending colon: Secondary | ICD-10-CM | POA: Diagnosis not present

## 2022-07-30 DIAGNOSIS — D125 Benign neoplasm of sigmoid colon: Secondary | ICD-10-CM | POA: Diagnosis not present

## 2022-07-30 DIAGNOSIS — Z86010 Personal history of colonic polyps: Secondary | ICD-10-CM | POA: Diagnosis not present

## 2022-07-30 DIAGNOSIS — D123 Benign neoplasm of transverse colon: Secondary | ICD-10-CM | POA: Diagnosis not present

## 2022-07-30 DIAGNOSIS — K648 Other hemorrhoids: Secondary | ICD-10-CM | POA: Diagnosis not present

## 2022-07-30 DIAGNOSIS — Z09 Encounter for follow-up examination after completed treatment for conditions other than malignant neoplasm: Secondary | ICD-10-CM | POA: Diagnosis not present

## 2022-08-02 DIAGNOSIS — D125 Benign neoplasm of sigmoid colon: Secondary | ICD-10-CM | POA: Diagnosis not present

## 2022-08-02 DIAGNOSIS — D123 Benign neoplasm of transverse colon: Secondary | ICD-10-CM | POA: Diagnosis not present

## 2022-08-02 DIAGNOSIS — D124 Benign neoplasm of descending colon: Secondary | ICD-10-CM | POA: Diagnosis not present

## 2023-02-07 DIAGNOSIS — H2513 Age-related nuclear cataract, bilateral: Secondary | ICD-10-CM | POA: Diagnosis not present

## 2023-02-07 DIAGNOSIS — H5213 Myopia, bilateral: Secondary | ICD-10-CM | POA: Diagnosis not present

## 2023-02-07 DIAGNOSIS — H353132 Nonexudative age-related macular degeneration, bilateral, intermediate dry stage: Secondary | ICD-10-CM | POA: Diagnosis not present

## 2023-04-29 DIAGNOSIS — Z125 Encounter for screening for malignant neoplasm of prostate: Secondary | ICD-10-CM | POA: Diagnosis not present

## 2023-04-29 DIAGNOSIS — Z136 Encounter for screening for cardiovascular disorders: Secondary | ICD-10-CM | POA: Diagnosis not present

## 2023-04-29 DIAGNOSIS — F17210 Nicotine dependence, cigarettes, uncomplicated: Secondary | ICD-10-CM | POA: Diagnosis not present

## 2023-05-01 DIAGNOSIS — L309 Dermatitis, unspecified: Secondary | ICD-10-CM | POA: Diagnosis not present

## 2023-05-01 DIAGNOSIS — Z0000 Encounter for general adult medical examination without abnormal findings: Secondary | ICD-10-CM | POA: Diagnosis not present

## 2023-05-01 DIAGNOSIS — D721 Eosinophilia, unspecified: Secondary | ICD-10-CM | POA: Diagnosis not present

## 2023-05-01 DIAGNOSIS — Z125 Encounter for screening for malignant neoplasm of prostate: Secondary | ICD-10-CM | POA: Diagnosis not present

## 2023-05-01 DIAGNOSIS — F17210 Nicotine dependence, cigarettes, uncomplicated: Secondary | ICD-10-CM | POA: Diagnosis not present

## 2023-05-01 DIAGNOSIS — Z13220 Encounter for screening for lipoid disorders: Secondary | ICD-10-CM | POA: Diagnosis not present

## 2023-05-01 DIAGNOSIS — K5900 Constipation, unspecified: Secondary | ICD-10-CM | POA: Diagnosis not present

## 2023-05-01 DIAGNOSIS — Z136 Encounter for screening for cardiovascular disorders: Secondary | ICD-10-CM | POA: Diagnosis not present

## 2023-05-01 DIAGNOSIS — R972 Elevated prostate specific antigen [PSA]: Secondary | ICD-10-CM | POA: Diagnosis not present

## 2023-05-01 DIAGNOSIS — Z122 Encounter for screening for malignant neoplasm of respiratory organs: Secondary | ICD-10-CM | POA: Diagnosis not present

## 2023-05-31 DIAGNOSIS — Z87891 Personal history of nicotine dependence: Secondary | ICD-10-CM | POA: Diagnosis not present

## 2023-05-31 DIAGNOSIS — Z136 Encounter for screening for cardiovascular disorders: Secondary | ICD-10-CM | POA: Diagnosis not present

## 2023-06-19 ENCOUNTER — Inpatient Hospital Stay: Payer: Medicare HMO

## 2023-06-19 ENCOUNTER — Inpatient Hospital Stay: Payer: Medicare HMO | Attending: Hematology | Admitting: Hematology

## 2023-06-19 VITALS — BP 148/60 | HR 76 | Temp 98.1°F | Resp 18 | Ht 69.5 in | Wt 149.7 lb

## 2023-06-19 DIAGNOSIS — D721 Eosinophilia, unspecified: Secondary | ICD-10-CM

## 2023-06-19 DIAGNOSIS — J301 Allergic rhinitis due to pollen: Secondary | ICD-10-CM | POA: Diagnosis not present

## 2023-06-19 DIAGNOSIS — L309 Dermatitis, unspecified: Secondary | ICD-10-CM | POA: Diagnosis not present

## 2023-06-19 DIAGNOSIS — F17210 Nicotine dependence, cigarettes, uncomplicated: Secondary | ICD-10-CM | POA: Diagnosis not present

## 2023-06-19 LAB — CBC WITH DIFFERENTIAL (CANCER CENTER ONLY)
Abs Immature Granulocytes: 0.01 10*3/uL (ref 0.00–0.07)
Basophils Absolute: 0.1 10*3/uL (ref 0.0–0.1)
Basophils Relative: 1 %
Eosinophils Absolute: 1.6 10*3/uL — ABNORMAL HIGH (ref 0.0–0.5)
Eosinophils Relative: 21 %
HCT: 39.7 % (ref 39.0–52.0)
Hemoglobin: 12.9 g/dL — ABNORMAL LOW (ref 13.0–17.0)
Immature Granulocytes: 0 %
Lymphocytes Relative: 19 %
Lymphs Abs: 1.5 10*3/uL (ref 0.7–4.0)
MCH: 32.3 pg (ref 26.0–34.0)
MCHC: 32.5 g/dL (ref 30.0–36.0)
MCV: 99.5 fL (ref 80.0–100.0)
Monocytes Absolute: 0.6 10*3/uL (ref 0.1–1.0)
Monocytes Relative: 7 %
Neutro Abs: 3.9 10*3/uL (ref 1.7–7.7)
Neutrophils Relative %: 52 %
Platelet Count: 216 10*3/uL (ref 150–400)
RBC: 3.99 MIL/uL — ABNORMAL LOW (ref 4.22–5.81)
RDW: 13.1 % (ref 11.5–15.5)
WBC Count: 7.6 10*3/uL (ref 4.0–10.5)
nRBC: 0 % (ref 0.0–0.2)

## 2023-06-19 LAB — CMP (CANCER CENTER ONLY)
ALT: 9 U/L (ref 0–44)
AST: 14 U/L — ABNORMAL LOW (ref 15–41)
Albumin: 4.6 g/dL (ref 3.5–5.0)
Alkaline Phosphatase: 62 U/L (ref 38–126)
Anion gap: 7 (ref 5–15)
BUN: 21 mg/dL (ref 8–23)
CO2: 27 mmol/L (ref 22–32)
Calcium: 9.1 mg/dL (ref 8.9–10.3)
Chloride: 107 mmol/L (ref 98–111)
Creatinine: 1.12 mg/dL (ref 0.61–1.24)
GFR, Estimated: >60 mL/min (ref >60)
Glucose, Bld: 86 mg/dL (ref 70–99)
Potassium: 4.6 mmol/L (ref 3.5–5.1)
Sodium: 141 mmol/L (ref 135–145)
Total Bilirubin: 0.5 mg/dL (ref 0.0–1.2)
Total Protein: 7.4 g/dL (ref 6.5–8.1)

## 2023-06-19 LAB — LACTATE DEHYDROGENASE: LDH: 141 U/L (ref 98–192)

## 2023-06-19 LAB — SEDIMENTATION RATE: Sed Rate: 9 mm/h (ref 0–16)

## 2023-06-19 NOTE — Progress Notes (Signed)
 HEMATOLOGY/ONCOLOGY CONSULTATION NOTE  Date of Service: 06/19/2023  Patient Care Team: Rickard Patience Shirlean Kelly., MD (Inactive) as PCP - General  CHIEF COMPLAINTS/PURPOSE OF CONSULTATION:  Evaluation and management of eosinophilia  HISTORY OF PRESENTING ILLNESS:  Howard Duncan is a wonderful 73 y.o. male who has been referred to [place] by Irven Coe, MD for evaluation and management of eosinophilia with EOS level of 1.2K from CBC on 04/29/2023.   Patient reports a hx of eczema and endorses plenty of allergies.   He reports that he has not felt differently over the last 1-2 years. Patient did not endorse any new symptoms with his recent routine labs.   He was first diagnosed with Eczema in the 1980s/90s which continues to be active year-round. His eczematous rash is present in bilateral feet and not present in any other part of the body. His eczema did previously improve with use of topical steroids. Patient denies any joint pain or swelling. Patient complains of itchiness. He does not manage his eczema with any medication at this time.   His other allergies are nasal, which is not year-round, but rather only present in the Spring and Fall. He does not use steroids or nasal spray for nasal alleriges.   He does take vitamin B12 and is on no other medicaions on a regular basis.   He does smoke half a pack of cigarettes daily since 73 years old. He notes that he did quit for some time in his younger years before restarting.    In regards to alcohol consumption, he consumes 3 beers daily. He notes that he previously had heavier consumption during his younger years. He denies any alcohol-related issues or liver issues.   He denies any other chemical exposures. He reports marijuana use when growing up but denies marijuana use at this time.  Patient denies any travel outside of the [place]. He does not work with Physicist, medical.   He denies any new bone pain, unexplained fever, chills, night sweats,  sudden weight loss, abdominal pain, change in bowel habits, or asthma-like symptoms such as wheezing.   He reports a hx of foot surgery of removal of a foreign object in the past as well as tonsillectomy.   Patient denies any other medical issues requiresing treatment in the past.   He reports no medical issues in fhx such as blood disorder or blood cancers. Patient reports that his uncle had throat cancer and frequently '"held a cigarette in his mouth all day" Patient also reports a fhx of Glaucoma. He denies any fhx of prostate cancer.   Patient did have elevated PSA levels which continues to be monitored by his PCP and is fairly stable. He denies any urinary symptoms or referral to urologist.   MEDICAL HISTORY:  Past Medical History:  Diagnosis Date   Heart murmur    Herpes simplex type 2 infection     SURGICAL HISTORY: No past surgical history on file.   SOCIAL HISTORY: Social History   Socioeconomic History   Marital status: Single    Spouse name: Not on file   Number of children: Not on file   Years of education: Not on file   Highest education level: Not on file  Occupational History   Not on file  Tobacco Use   Smoking status: Every Day    Current packs/day: 1.50    Types: Cigarettes   Smokeless tobacco: Not on file  Substance and Sexual Activity   Alcohol use: Yes  Alcohol/week: 42.0 standard drinks of alcohol    Types: 42 Cans of beer per week    Comment: 6-8 beers per day   Drug use: No   Sexual activity: Not on file  Other Topics Concern   Not on file  Social History Narrative   Not on file   Social Drivers of Health   Financial Resource Strain: Not on file  Food Insecurity: Not on file  Transportation Needs: Not on file  Physical Activity: Not on file  Stress: Not on file  Social Connections: Not on file  Intimate Partner Violence: Not on file     FAMILY HISTORY: Family History  Problem Relation Age of Onset   Arthritis Other    Diabetes  Other     ALLERGIES:  is allergic to aspirin.  MEDICATIONS:  Current Outpatient Medications  Medication Sig Dispense Refill   aspirin 81 MG tablet Take 81 mg by mouth daily.       clotrimazole-betamethasone (LOTRISONE) cream Apply 1 application topically 2 (two) times daily. For 10 days, then repeat later as needed      valACYclovir (VALTREX) 500 MG tablet Take 500 mg by mouth daily.       No current facility-administered medications for this visit.    REVIEW OF SYSTEMS:    10 Point review of Systems was done is negative except as noted above.  PHYSICAL EXAMINATION: ECOG PERFORMANCE STATUS: {CHL ONC ECOG [id number]}  .There were no vitals filed for this visit. There were no vitals filed for this visit. .There is no height or weight on file to calculate BMI.  GENERAL:alert, in no acute distress and comfortable SKIN: no acute rashes, no significant lesions EYES: conjunctiva are pink and non-injected, sclera anicteric OROPHARYNX: MMM, no exudates, no oropharyngeal erythema or ulceration NECK: supple, no JVD LYMPH:  no palpable lymphadenopathy in the cervical, axillary or inguinal regions LUNGS: clear to auscultation b/l with normal respiratory effort HEART: regular rate & rhythm ABDOMEN:  normoactive bowel sounds , non tender, not distended. Extremity: no pedal edema PSYCH: alert & oriented x 3 with fluent speech NEURO: no focal motor/sensory deficits  LABORATORY DATA:  I have reviewed the data as listed  .    Latest Ref Rng & Units 07/25/2010    8:34 AM 09/14/2008    8:32 AM  CBC  WBC 4.5 - 10.5 K/uL 6.9  5.7   Hemoglobin 13.0 - 17.0 g/dL 81.1  91.4   Hematocrit 39.0 - 52.0 % 40.5  40.0   Platelets 150.0 - 400.0 K/uL 207.0  195.0        .    Latest Ref Rng & Units 07/25/2010    8:34 AM 09/14/2008    8:32 AM  CMP  Glucose 70 - 99 mg/dL 92  88   BUN 6 - 23 mg/dL 10  12   Creatinine 0.4 - 1.5 mg/dL 0.7  0.9   Sodium 782 - 145 mEq/L 139  141   Potassium 3.5  - 5.1 mEq/L 4.2  4.8   Chloride 96 - 112 mEq/L 104  110   CO2 19 - 32 mEq/L 27  27   Calcium 8.4 - 10.5 mg/dL 8.8  9.0   Total Protein 6.0 - 8.3 g/dL 6.6  6.7   Total Bilirubin 0.3 - 1.2 mg/dL 0.5  0.6   Alkaline Phos 39 - 117 U/L 40  40   AST 0 - 37 U/L 17  20   ALT 0 - 53 U/L 15  15      RADIOGRAPHIC STUDIES: I have personally reviewed the radiological images as listed and agreed with the findings in the report. No results found.  ASSESSMENT & PLAN:  73 y.o. 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 Discussed that the most numerous of which are neutrophils, which help fight bacterial infections. Another common type is lymphocytes which help fight viral infections. Of the remaining 5-10% of WBCs, there are eosinophils, which modulate the immune response and respond to parasitic infections. Discussed that eosinophils are also elevated with alleriges or other inflammatory conditions. Discussed that normal eosinophil max is 0.5 K/UL.  -discussed goal to determine whether his elevated eosinophil is related to a reactive process or bone marrow disorder -CBC from 04/29/2023 showed EOS 1.2K, Total WBC 7.6K, HGB 12.5, and PLT 241 -platelets and hgb normal -on review of previous lab work, his elevated eosinophils appears to be very chronic since 2012  -did not hear any major clinical concerns -discussed that his elevated eosinophils is most likely reactive and not related to a bone marrow issue -educated patient that in the case of MPN, other blood counts besides WBCs would be abnormal as well. Discussed that there is no suggestion of at this time.  -discussed that eosinophils could be reactively increased from external factors such as eczema, allergies, smoking, and decent alcohol consumption -patient highly unlikely to have  parasitic infection -will order blood testing at this time for further evaluation -discussed that if there is concern for persistent significant elevation of eosinophils, there may be a role to evaluate for genetic changes to rule out bone marrow muation causing elevated eosinophils -discussed option to use topical steroids to improve eczema -will plan for phone visit in 2 weeks  FOLLOW-UP: Labs today Phone visit with Dr Candise Che in 2 weeks  The total time spent in the appointment was *** minutes* .  All of the patient's questions were answered with apparent satisfaction. The patient knows to call the clinic with any problems, questions or concerns.   Wyvonnia Lora MD MS AAHIVMS [facility] Hematology/Oncology Physician [facility]  .*Total Encounter Time as defined by the Centers for Medicare and Medicaid Services includes, in addition to the face-to-face time of a patient visit (documented in the note above) non-face-to-face time: obtaining and reviewing outside history, ordering and reviewing medications, tests or procedures, care coordination (communications with other health care professionals or caregivers) and documentation in the medical record.    I,Mitra Faeizi,acting as a Neurosurgeon for Wyvonnia Lora, MD.,have documented all relevant documentation on the behalf of Wyvonnia Lora, MD,as directed by  Wyvonnia Lora, MD while in the presence of Wyvonnia Lora, MD.  ***

## 2023-06-21 ENCOUNTER — Telehealth: Payer: Self-pay | Admitting: Hematology

## 2023-06-21 LAB — TRYPTASE: Tryptase: 13.3 ug/L — ABNORMAL HIGH (ref 2.2–13.2)

## 2023-06-21 NOTE — Telephone Encounter (Signed)
 Left patient a vm regarding upcoming appointment

## 2023-06-22 LAB — MPN W/HYPEREOSINOPHILIA FISH

## 2023-06-26 LAB — ANTINUCLEAR ANTIBODIES, IFA: ANA Ab, IFA: NEGATIVE

## 2023-07-03 ENCOUNTER — Telehealth: Admitting: Hematology

## 2023-07-08 ENCOUNTER — Inpatient Hospital Stay: Attending: Hematology | Admitting: Hematology

## 2023-07-08 DIAGNOSIS — D721 Eosinophilia, unspecified: Secondary | ICD-10-CM | POA: Diagnosis not present

## 2023-07-08 NOTE — Progress Notes (Signed)
 HEMATOLOGY/ONCOLOGY TELE-MED VISIT NOTE  Date of Service: 07/08/2023  Patient Care Team: Antonia Battiest Carlyn Childs., MD (Inactive) as PCP - General  CHIEF COMPLAINTS/PURPOSE OF CONSULTATION:  Evaluation and management of eosinophilia  HISTORY OF PRESENTING ILLNESS:   Howard Duncan is a wonderful 73 y.o. male who has been referred to us  by Benedetto Brady, MD for evaluation and management of eosinophilia with EOS level of 1.2K from CBC on 04/29/2023.   Patient reports a hx of eczema and endorses plenty of allergies.   He reports that he has not felt differently over the last 1-2 years. Patient did not endorse any new symptoms with his recent routine labs.   He was first diagnosed with Eczema in the 1980s/90s which continues to be active year-round. His eczematous rash is present in bilateral feet and not present in any other part of the body. His eczema did previously improve with use of topical steroids. Patient denies any joint pain or swelling. Patient complains of itchiness. He does not manage his eczema with any medication at this time.   His other allergies are nasal, which is not year-round, but rather only present in the Spring and Fall. He does not use steroids or nasal spray for nasal alleriges.   He does take vitamin B12 and is on no other medicaions on a regular basis.   He does smoke half a pack of cigarettes daily since 73 years old. He notes that he did quit for some time in his younger years before restarting.    In regards to alcohol consumption, he consumes 3 beers daily. He notes that he previously had heavier consumption during his younger years. He denies any alcohol-related issues or liver issues.   He denies any other chemical exposures. He reports marijuana use when growing up but denies marijuana use at this time.  Patient denies any travel outside of the [place] . He does not work with animals.   He denies any new bone pain, unexplained fever, chills, night  sweats, sudden weight loss, abdominal pain, change in bowel habits, or asthma-like symptoms such as wheezing.   He reports a hx of foot surgery of removal of a foreign object in the past as well as tonsillectomy.   Patient denies any other medical issues requiresing treatment in the past.   He reports no medical issues in fhx such as blood disorder or blood cancers. Patient reports that his uncle had throat cancer and frequently '"held a cigarette in his mouth all day" Patient also reports a fhx of Glaucoma. He denies any fhx of prostate cancer.   Patient did have elevated PSA levels which continues to be monitored by his PCP and is fairly stable. He denies any urinary symptoms or referral to urologist.   INTERVAL HISTORY: Howard Duncan is a wonderful 73 y.o. male who is connected via phone for continued evaluation and management of eosinophilia. Patient was initially seen by me on 06/19/2023.   .I connected with Howard Duncan on 07/08/2023 at  3:30 PM EDT by telephone visit and verified that I am speaking with the correct person using two identifiers.   Patient notes he has been doing well overall since our last visit. He denies any new infection issues, fever, chills, night sweats, unexpected weight loss, back pain, chest pain, abdominal pain, or leg swelling.   Discussed lab results fro 06/19/2023 in detail with the patient.   I discussed the limitations, risks, security and privacy concerns of performing an  evaluation and management service by telemedicine and the availability of in-person appointments. I also discussed with the patient that there may be a patient responsible charge related to this service. The patient expressed understanding and agreed to proceed.   Other persons participating in the visit and their role in the encounter: None   Patient's location: Home  Provider's location: [facility]   Chief Complaint: eosinophilia    MEDICAL HISTORY:  Past Medical History:   Diagnosis Date   Heart murmur    Herpes simplex type 2 infection     SURGICAL HISTORY: No past surgical history on file.   SOCIAL HISTORY: Social History   Socioeconomic History   Marital status: Single    Spouse name: Not on file   Number of children: Not on file   Years of education: Not on file   Highest education level: Not on file  Occupational History   Not on file  Tobacco Use   Smoking status: Every Day    Current packs/day: 1.50    Types: Cigarettes   Smokeless tobacco: Not on file  Substance and Sexual Activity   Alcohol use: Yes    Alcohol/week: 42.0 standard drinks of alcohol    Types: 42 Cans of beer per week    Comment: 6-8 beers per day   Drug use: No   Sexual activity: Not on file  Other Topics Concern   Not on file  Social History Narrative   Not on file   Social Drivers of Health   Financial Resource Strain: Not on file  Food Insecurity: Not on file  Transportation Needs: Not on file  Physical Activity: Not on file  Stress: Not on file  Social Connections: Not on file  Intimate Partner Violence: Not on file     FAMILY HISTORY: Family History  Problem Relation Age of Onset   Arthritis Other    Diabetes Other     ALLERGIES:  is allergic to aspirin.  MEDICATIONS:  Current Outpatient Medications  Medication Sig Dispense Refill   aspirin 81 MG tablet Take 81 mg by mouth daily.       clotrimazole-betamethasone (LOTRISONE) cream Apply 1 application topically 2 (two) times daily. For 10 days, then repeat later as needed      valACYclovir (VALTREX) 500 MG tablet Take 500 mg by mouth daily.       No current facility-administered medications for this visit.    REVIEW OF SYSTEMS:    10 Point review of Systems was done is negative except as noted above.  PHYSICAL EXAMINATION: TELE-MED VISIT  LABORATORY DATA:  I have reviewed the data as listed  .    Latest Ref Rng & Units 06/19/2023    3:36 PM 07/25/2010    8:34 AM 09/14/2008     8:32 AM  CBC  WBC 4.0 - 10.5 K/uL 7.6  6.9  5.7   Hemoglobin 13.0 - 17.0 g/dL 09.8  11.9  14.7   Hematocrit 39.0 - 52.0 % 39.7  40.5  40.0   Platelets 150 - 400 K/uL 216  207.0  195.0        .    Latest Ref Rng & Units 06/19/2023    3:36 PM 07/25/2010    8:34 AM 09/14/2008    8:32 AM  CMP  Glucose 70 - 99 mg/dL 86  92  88   BUN 8 - 23 mg/dL 21  10  12    Creatinine 0.61 - 1.24 mg/dL 8.29  0.7  0.9  Sodium 135 - 145 mmol/L 141  139  141   Potassium 3.5 - 5.1 mmol/L 4.6  4.2  4.8   Chloride 98 - 111 mmol/L 107  104  110   CO2 22 - 32 mmol/L 27  27  27    Calcium 8.9 - 10.3 mg/dL 9.1  8.8  9.0   Total Protein 6.5 - 8.1 g/dL 7.4  6.6  6.7   Total Bilirubin 0.0 - 1.2 mg/dL 0.5  0.5  0.6   Alkaline Phos 38 - 126 U/L 62  40  40   AST 15 - 41 U/L 14  17  20    ALT 0 - 44 U/L 9  15  15       RADIOGRAPHIC STUDIES: I have personally reviewed the radiological images as listed and agreed with the findings in the report. No results found.  ASSESSMENT & PLAN:   73 y.o. 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 Eczema  Nasal allergies   PLAN: -Discussed lab results from 06/19/2023 in detail with the patient. CBC and CMP stable. Eosinophils are elevated at 1.6 K. Sedimentation rate of 9. Elevated tryptase level of 13.3. LDH level of 141. ANA, Ab, IFA negative.  -MPN w/Hypereosinophilia FISH results did not show any abnormalities.   -Labs and mutation resting did not show bone marrow issue that is causing elevated eosinophils.  -Discussed with the patient that chronic elevated eosinophils are likely reactive due to allergies and Eczema.  -RTC with PCP.  Aaron Aas No orders of the defined types were placed in this encounter.   FOLLOW-UP: RTC with PCP  The total time spent in the appointment was 20 minutes* .  All of the patient's questions were answered with apparent satisfaction. The patient knows to call the  clinic with any problems, questions or concerns.   Jacquelyn Matt MD MS AAHIVMS [facility] Hematology/Oncology Physician [facility]  .*Total Encounter Time as defined by the Centers for Medicare and Medicaid Services includes, in addition to the face-to-face time of a patient visit (documented in the note above) non-face-to-face time: obtaining and reviewing outside history, ordering and reviewing medications, tests or procedures, care coordination (communications with other health care professionals or caregivers) and documentation in the medical record.   I,Param Shah,acting as a Neurosurgeon for Jacquelyn Matt, MD.,have documented all relevant documentation on the behalf of Jacquelyn Matt, MD,as directed by  Jacquelyn Matt, MD while in the presence of Jacquelyn Matt, MD.  .I have reviewed the above documentation for accuracy and completeness, and I agree with the above. .Aeon Kessner Kishore Demarie Hyneman MD

## 2023-10-24 ENCOUNTER — Encounter: Payer: Self-pay | Admitting: *Deleted

## 2023-10-28 DIAGNOSIS — R972 Elevated prostate specific antigen [PSA]: Secondary | ICD-10-CM | POA: Diagnosis not present

## 2024-02-10 DIAGNOSIS — H5213 Myopia, bilateral: Secondary | ICD-10-CM | POA: Diagnosis not present

## 2024-02-10 DIAGNOSIS — H353132 Nonexudative age-related macular degeneration, bilateral, intermediate dry stage: Secondary | ICD-10-CM | POA: Diagnosis not present

## 2024-02-10 DIAGNOSIS — H35371 Puckering of macula, right eye: Secondary | ICD-10-CM | POA: Diagnosis not present

## 2024-02-10 DIAGNOSIS — H2513 Age-related nuclear cataract, bilateral: Secondary | ICD-10-CM | POA: Diagnosis not present

## 2024-03-31 ENCOUNTER — Telehealth: Payer: Self-pay | Admitting: Acute Care

## 2024-03-31 DIAGNOSIS — Z87891 Personal history of nicotine dependence: Secondary | ICD-10-CM

## 2024-03-31 DIAGNOSIS — Z122 Encounter for screening for malignant neoplasm of respiratory organs: Secondary | ICD-10-CM

## 2024-03-31 DIAGNOSIS — F17210 Nicotine dependence, cigarettes, uncomplicated: Secondary | ICD-10-CM

## 2024-03-31 NOTE — Telephone Encounter (Signed)
 Lung Cancer Screening Narrative/Criteria Questionnaire (Cigarette Smokers Only- No Cigars/Pipes/vapes)   Howard Duncan   SDMV:04/15/24@230p [place]                                           Apr 17, 1950               DCT: 04/16/24@2pm /[address]    73 y.o.   Phone: [phone]  Lung Screening Narrative (confirm age 69-77 yrs Medicare / 50-80 yrs Private pay insurance)   Insurance information:HTA    Referring Provider:Hammer   This screening involves an initial phone call with a team member from our program. It is called a shared decision making visit. The initial meeting is required by insurance and Medicare to make sure you understand the program. This appointment takes about 15-20 minutes to complete. The CT scan will completed at a separate date/time. This scan takes about 5-10 minutes to complete and you may eat and drink before and after the scan.  Criteria questions for Lung Cancer Screening:   Are you a current or former smoker? Current Age began smoking: 20y      If you are a former smoker, what year did you quit smoking? Quit once for 5 years   To calculate your smoking history, I need an accurate estimate of how many packs of cigarettes you smoked per day and for how many years. (Not just the number of PPD you are now smoking)   Years smoking 48 x Packs per day one half = Pack years 24   (at least 20 pack yrs)   (Make sure they understand that we need to know how much they have smoked in the past, not just the number of PPD they are smoking now)  Do you have a personal history of cancer?  No    Do you have a family history of cancer? Yes  (cancer type and and relative) Mat aunt/breast and mat uncle/throat  Are you coughing up blood?  No  Have you had unexplained weight loss of 15 lbs or more in the last 6 months? No  It looks like you meet all criteria.     Additional information: N/A

## 2024-04-15 ENCOUNTER — Ambulatory Visit

## 2024-04-15 DIAGNOSIS — Z122 Encounter for screening for malignant neoplasm of respiratory organs: Secondary | ICD-10-CM

## 2024-04-15 DIAGNOSIS — Z720 Tobacco use: Secondary | ICD-10-CM

## 2024-04-15 NOTE — Progress Notes (Signed)
 Virtual Visit via Telephone Note  I connected with Zachary JULIANNA Rattler on 04/15/2024 at  2:00 PM EST by telephone and verified that I am speaking with the correct person using two identifiers.  Location: Patient: At home, in [place]  Provider: [address], [place], [place], [address]    I discussed the limitations, risks, security and privacy concerns of performing an evaluation and management service by telephone and the availability of in person appointments. I also discussed with the patient that there may be a patient responsible charge related to this service. The patient expressed understanding and agreed to proceed.  Shared Decision Making Visit Lung Cancer Screening Program [phone])   Eligibility: Age 74 y.o. Pack Years Smoking History Calculation 24 pack years  (# packs/per year x # years smoked) Recent History of coughing up blood  no Unexplained weight loss? no ( >Than 15 pounds within the last 6 months ) Prior History Lung / other cancer no (Diagnosis within the last 5 years already requiring surveillance chest CT Scans). Smoking Status Current Smoker Former Smokers: Years since quit: N/A  Quit Date: N/A  Visit Components: Discussion included one or more decision making aids. yes Discussion included risk/benefits of screening. yes Discussion included potential follow up diagnostic testing for abnormal scans. yes Discussion included meaning and risk of over diagnosis. yes Discussion included meaning and risk of False Positives. yes Discussion included meaning of total radiation exposure. yes  Counseling Included: Importance of adherence to annual lung cancer LDCT screening. yes Impact of comorbidities on ability to participate in the program. yes Ability and willingness to under diagnostic treatment. yes  Smoking Cessation Counseling: Current Smokers:  Discussed importance of smoking cessation. yes Information about tobacco cessation classes and interventions  provided to patient. yes Patient provided with ticket for LDCT Scan. N/A Symptomatic Patient. yes  Counseling(Intermediate counseling: > three minutes) [address]406 Diagnosis Code: Tobacco Use Z72.0 Asymptomatic Patient no  Counseling N/A Former Smokers:  Discussed the importance of maintaining cigarette abstinence. N/A Diagnosis Code: Personal History of Nicotine Dependence. S12.108 Information about tobacco cessation classes and interventions provided to patient. Yes Patient provided with ticket for LDCT Scan. N/A Written Order for Lung Cancer Screening with LDCT placed in Epic. Yes (CT Chest Lung Cancer Screening Low Dose W/O CM) [id number] Z12.2-Screening of respiratory organs Z87.891-Personal history of nicotine dependence   UCHENNA RAPPAPORT is a current user of tobacco or nicotine products. He is ready to quit at this time. He wants to quit smoking without medications including nicotine patches. Discussed behavioral options and reduce to quit versus setting a quit date.  Counseling provided today addressed the risks of continued use and the benefits of cessation. Discussed tobacco/nicotine use history, readiness to quit, and evidence-based treatment options including behavioral strategies, support resources, and pharmacologic therapies. Provided encouragement and educational materials on steps and resources to quit smoking. Patient questions were addressed, and follow-up recommended for continued support. Total time spent on counseling: 4 minutes.   Wells CHRISTELLA [place], FNP

## 2024-04-15 NOTE — Patient Instructions (Signed)
 Thank you for participating in the [facility] Screening Program. It was our pleasure to meet you today. We will call you with the results of your scan within the next few days. Your scan will be assigned a Lung RADS category score by the physicians reading the scans.  This Lung RADS score determines follow up scanning.  See below for description of categories, and follow up screening recommendations. We will be in touch to schedule your follow up screening annually or based on recommendations of our providers. We will fax a copy of your scan results to your Primary Care Physician, or the physician who referred you to the program, to ensure they have the results. Please call the office if you have any questions or concerns regarding your scanning experience or results.  Our office number is [phone]. Please speak with Karna Curly, RN., Karna Doom RN, or [facility] RN, and Isaiah Dover RN. They are  our Lung Cancer Screening RN.'s If They are unavailable when you call, Please leave a message on the voice mail. We will return your call at our earliest convenience.This voice mail is monitored several times a day.  Remember, if your scan is normal, we will scan you annually as long as you continue to meet the criteria for the program. (Age 67-80, Current smoker or smoker who has quit within the last 15 years). If you are a smoker, remember, quitting is the single most powerful action that you can take to decrease your risk of lung cancer and other pulmonary, breathing related problems. We know quitting is hard, and we are here to help.  Please let us  know if there is anything we can do to help you meet your goal of quitting. If you are a former smoker, counselling psychologist. We are proud of you! Remain smoke free! Remember you can refer friends or family members through the number above.  We will screen them to make sure they meet criteria for the program. Thank you for helping us   take better care of you by participating in Lung Screening.   Lung RADS Categories:  Lung RADS 1: no nodules or definitely non-concerning nodules.  Recommendation is for a repeat annual scan in 12 months.  Lung RADS 2:  nodules that are non-concerning in appearance and behavior with a very low likelihood of becoming an active cancer. Recommendation is for a repeat annual scan in 12 months.  Lung RADS 3: nodules that are probably non-concerning , includes nodules with a low likelihood of becoming an active cancer.  Recommendation is for a 54-month repeat screening scan. Often noted after an upper respiratory illness. We will be in touch to make sure you have no questions, and to schedule your 54-month scan.  Lung RADS 4 A: nodules with concerning findings, recommendation is most often for a follow up scan in 3 months or additional testing based on our provider's assessment of the scan. We will be in touch to make sure you have no questions and to schedule the recommended 3 month follow up scan.  Lung RADS 4 B:  indicates findings that are concerning. We will be in touch with you to schedule additional diagnostic testing based on our provider's  assessment of the scan.  Smoking Cessation  Tips to Quit:  Pick a Quit Day within the next week.  Remove temptations: toss cigarettes, lighters, ashtrays.  Tell someone you trust for support.  Avoid triggers like stress, boredom, or being around smokers.  Use healthy replacements:  gum, walking, deep breaths.  Stay busy with hobbies, music, drawing, or exercise.  Be patient with yourself--slipping once doesnt mean failure.   [place] Smoking/Nicotine Cessation Resources  If youre ready to quit TODAY, our virtual care team is available to start your journey to a tobacco free life.  Appointments are available from 8 a.m. to 8 p.m. Monday to Friday. Most health insurance plans will cover some level of tobacco cessation visits and medications.   To make a virtual appointment and access other resources, follow the link: severties.nl   [facility] (1-800-QUITNOW) [employer] offers free combination nicotine replacement therapy (patches plus either gum or lozenges). When used along with counseling, this will more than double your chances of quitting successfully.  Call 1-800-QUIT NOW or Text: Ready to 34191.   Spanish language portal: 1-855-DEJELO-YA 915-775-6013).  TTY: (641) 638-4428 [place] Quitline: Call 888-7AI-QUIT 667-482-9429) http://carroll-castaneda.info/  The American Lung Association offers Freedom From Smoking Programs Self guided or group programs offered, check their website for free virtual programs available to [facility] residents Lung Helpline at 1-800-LUNGUSA  http://keith.biz/  [employer] Offers tools and tips to quit smoking.  Free quitSTART app:   Monitor progress, manage cravings, access tools, and more with the app.  portablegrid.se  Hypnosis for smoking cessation  [employer] 801-184-8737  Acupuncture for smoking cessation  [employer] [facility] [phone]    Please let us  know how we can support you as you quit smoking. I know this is hard, but you can do this!

## 2024-04-16 ENCOUNTER — Inpatient Hospital Stay: Admission: RE | Admit: 2024-04-16 | Source: Ambulatory Visit
